# Patient Record
Sex: Male | Born: 2000 | Race: Black or African American | Hispanic: No | Marital: Single | Smoking: Never smoker
Health system: Southern US, Community
[De-identification: ages and names within clinical notes are randomized; demographics above are authoritative.]

## PROBLEM LIST (undated history)

## (undated) DIAGNOSIS — M25561 Pain in right knee: Secondary | ICD-10-CM

## (undated) DIAGNOSIS — M25562 Pain in left knee: Secondary | ICD-10-CM

## (undated) HISTORY — PX: OTHER SURGICAL HISTORY: SHX169

## (undated) HISTORY — DX: Pain in right knee: M25.561

## (undated) HISTORY — DX: Pain in left knee: M25.562

---
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
## 2022-06-12 ENCOUNTER — Encounter (HOSPITAL_BASED_OUTPATIENT_CLINIC_OR_DEPARTMENT_OTHER): Payer: Self-pay | Admitting: Emergency Medicine

## 2022-06-12 ENCOUNTER — Emergency Department (HOSPITAL_BASED_OUTPATIENT_CLINIC_OR_DEPARTMENT_OTHER): Payer: 59

## 2022-06-12 ENCOUNTER — Emergency Department (HOSPITAL_BASED_OUTPATIENT_CLINIC_OR_DEPARTMENT_OTHER)
Admission: EM | Admit: 2022-06-12 | Discharge: 2022-06-12 | Disposition: A | Discharge status: Home / Self Care | Payer: 59 | Attending: Emergency Medicine | Admitting: Emergency Medicine

## 2022-06-12 ENCOUNTER — Other Ambulatory Visit: Payer: Self-pay

## 2022-06-12 DIAGNOSIS — S91331A Puncture wound without foreign body, right foot, initial encounter: Secondary | ICD-10-CM | POA: Diagnosis not present

## 2022-06-12 DIAGNOSIS — X58XXXA Exposure to other specified factors, initial encounter: Secondary | ICD-10-CM | POA: Diagnosis not present

## 2022-06-12 DIAGNOSIS — S99921A Unspecified injury of right foot, initial encounter: Secondary | ICD-10-CM | POA: Diagnosis present

## 2022-06-12 MED ORDER — CIPROFLOXACIN HCL 500 MG PO TABS
500.0000 mg | ORAL_TABLET | Freq: Once | ORAL | Status: AC
  Administered 2022-06-12: 500 mg via ORAL
  Filled 2022-06-12: qty 1

## 2022-06-12 MED ORDER — CIPROFLOXACIN HCL 500 MG PO TABS: 500.0000 mg | ORAL_TABLET | Freq: Two times a day (BID) | ORAL | 0 refills | Status: AC

## 2022-06-12 NOTE — ED Provider Notes (Signed)
[facility] EMERGENCY DEPARTMENT Provider Note   CSN: [id number] Arrival date & time: 06/12/22  1848     History  Chief Complaint  Patient presents with   Foot Pain    Brian Sutton is a 21 y.o. male.  Brian Sutton is a 21 y.o. male who is otherwise healthy, presents to the ED for evaluation of wound to the bottom of his right foot.  [place].  He removed the splinter.  [place].  He reports since then he has had a little bit of redness and swelling.  He reports his sister squeezed a tiny amount of pus from the puncture wound but he is continued to have pain that is worse with weightbearing.  No fevers or chills.  Reports a small area of swelling.  Tetanus up-to-date.   The history is provided by the patient.  Foot Pain       Home Medications Prior to Admission medications   Medication Sig Start Date End Date Taking? Authorizing Provider  ciprofloxacin (CIPRO) 500 MG tablet Take 1 tablet (500 mg total) by mouth 2 (two) times daily. One po bid x 7 days 06/12/22  Yes Dartha Lodge, PA-C  acetaminophen (TYLENOL) 325 MG tablet Take 2 tablets (650 mg total) by mouth every 6 (six) hours as needed for headache. 10/08/14   Fayrene Helper, PA-C  naproxen sodium (ANAPROX) 220 MG tablet Take 220 mg by mouth as needed.     [provider]      Allergies    Patient has no known allergies.    Review of Systems   Review of Systems  Constitutional:  Negative for chills and fever.  Skin:  Positive for wound.    Physical Exam Updated Vital Signs BP (!) 147/90 (BP Location: Right Arm)   Pulse 90   Temp 98 F (36.7 C) (Oral)   Resp 18   Ht 5\' 6"  (1.676 m)   Wt 60.3 kg   SpO2 93%   BMI 21.46 kg/m  Physical Exam Vitals and nursing note reviewed.  Constitutional:      General: He is not in acute distress.    Appearance:  Normal appearance. He is well-developed. He is not ill-appearing or diaphoretic.  HENT:     Head: Normocephalic and atraumatic.  Eyes:     General:        Right eye: No discharge.        Left eye: No discharge.  Pulmonary:     Effort: Pulmonary effort is normal. No respiratory distress.  Musculoskeletal:     Comments: There is a very small puncture wound to the bottom of the right foot at the base of the big toe with a little bit of erythema, no fluctuance or expressible drainage.  No palpable foreign body.  No lymphangitic streaking or significant soft tissue swelling.  Distal pulses 2+ and normal cap refill.  Skin:    General: Skin is warm and dry.  Neurological:     Mental Status: He is alert and oriented to person, place, and time.     Coordination: Coordination normal.  Psychiatric:        Mood and Affect: Mood normal.        Behavior: Behavior normal.     ED Results / Procedures / Treatments  Labs (all labs ordered are listed, but only abnormal results are displayed) Labs Reviewed - No data to display  EKG None  Radiology DG Foot Complete Right  Result Date: 06/12/2022 CLINICAL DATA:  Swelling and pain in the foot after removing a splinter EXAM: RIGHT FOOT COMPLETE - 3+ VIEW COMPARISON:  None Available. FINDINGS: There is no evidence of fracture or dislocation. There is no evidence of arthropathy or other focal bone abnormality. Soft tissues are unremarkable. No radiopaque foreign body. A 2 IMPRESSION: Negative. Electronically Signed   By: Emmaline Kluver M.D.   On: 06/12/2022 19:18    Procedures Procedures    Medications Ordered in ED Medications  ciprofloxacin (CIPRO) tablet 500 mg (has no administration in time range)    ED Course/ Medical Decision Making/ A&P                           Medical Decision Making Amount and/or Complexity of Data Reviewed Radiology: ordered.  Risk Prescription drug management.   21 year old male presents with wound to the  bottom of the right foot where he [place].  Reports some worsening pain and swelling and a little bit of redness.  No expressible drainage or fluctuance on exam and no palpable foreign body.  X-ray reviewed and interpreted by myself with no radiopaque foreign body, bony abnormality or significant soft tissue abnormality.  Do not feel there is any drainable abscess on exam, but will treat with Cipro for puncture wound to the plantar surface of the foot.  Discussed return precautions and outpatient follow-up with patient.  Discharged home in good condition.        Final Clinical Impression(s) / ED Diagnoses Final diagnoses:  Puncture wound of plantar aspect of right foot, initial encounter    Rx / DC Orders ED Discharge Orders          Ordered    ciprofloxacin (CIPRO) 500 MG tablet  2 times daily        06/12/22 2121              Dartha Lodge, PA-C 06/22/22 0356    Charlynne Pander, MD 06/24/22 1600

## 2022-06-20 ENCOUNTER — Other Ambulatory Visit: Payer: Self-pay

## 2022-06-20 ENCOUNTER — Encounter (HOSPITAL_BASED_OUTPATIENT_CLINIC_OR_DEPARTMENT_OTHER): Payer: Self-pay

## 2022-06-20 ENCOUNTER — Emergency Department (HOSPITAL_BASED_OUTPATIENT_CLINIC_OR_DEPARTMENT_OTHER)
Admission: EM | Admit: 2022-06-20 | Discharge: 2022-06-20 | Disposition: A | Discharge status: Home / Self Care | Payer: 59 | Attending: Emergency Medicine | Admitting: Emergency Medicine

## 2022-06-20 DIAGNOSIS — Z4889 Encounter for other specified surgical aftercare: Secondary | ICD-10-CM | POA: Diagnosis present

## 2022-06-20 DIAGNOSIS — Z23 Encounter for immunization: Secondary | ICD-10-CM | POA: Insufficient documentation

## 2022-06-20 DIAGNOSIS — M79671 Pain in right foot: Secondary | ICD-10-CM | POA: Diagnosis not present

## 2022-06-20 MED ORDER — TETANUS-DIPHTH-ACELL PERTUSSIS 5-2.5-18.5 LF-MCG/0.5 IM SUSY
0.5000 mL | PREFILLED_SYRINGE | Freq: Once | INTRAMUSCULAR | Status: AC
  Administered 2022-06-20: 0.5 mL via INTRAMUSCULAR
  Filled 2022-06-20: qty 0.5

## 2022-06-20 MED ORDER — CEPHALEXIN 500 MG PO CAPS: 500.0000 mg | ORAL_CAPSULE | Freq: Four times a day (QID) | ORAL | 0 refills | Status: AC

## 2022-06-20 NOTE — ED Triage Notes (Signed)
Patient was seen 6/26 with complaints of splinter from the bottom of his right foot - Patient is here today due to increased pain.

## 2022-06-20 NOTE — Discharge Instructions (Signed)
Tetanus was updated today, this will be good for 10 years.  He can take Tylenol Motrin for pain.  Take Keflex 4 times daily for the next 5 days for infectious symptoms.  Continue doing warm compresses.  Return to the ED for fevers, chills, spreading redness around the foot.

## 2022-06-20 NOTE — ED Provider Notes (Signed)
  [facility] EMERGENCY DEPARTMENT Provider Note   CSN: [id number] Arrival date & time: 06/20/22  1612     History  Chief Complaint  Patient presents with   Foot Pain    Brian Sutton is a 21 y.o. male.   Foot Pain    Patient presents due to wound recheck.  Patient had a splinter on his foot, was seen 06/12/2022 and given antibiotics.  Has been taking the ciprofloxacin, finished dose yesterday.  Denies any fevers, redness, warmth.  He is having pain and states there has been some drainage from the site.  Denies any paresthesias, no history of diabetes.  Home Medications Prior to Admission medications   Medication Sig Start Date End Date Taking? Authorizing Provider  acetaminophen (TYLENOL) 325 MG tablet Take 2 tablets (650 mg total) by mouth every 6 (six) hours as needed for headache. 10/08/14   Fayrene Helper, PA-C  ciprofloxacin (CIPRO) 500 MG tablet Take 1 tablet (500 mg total) by mouth 2 (two) times daily. One po bid x 7 days 06/12/22   Dartha Lodge, PA-C  naproxen sodium (ANAPROX) 220 MG tablet Take 220 mg by mouth as needed.     [provider]      Allergies    Patient has no known allergies.    Review of Systems   Review of Systems  Physical Exam Updated Vital Signs BP (!) 145/66 (BP Location: Left Arm)   Pulse 75   Temp 97.9 F (36.6 C) (Oral)   Resp 18   Ht 5\' 6"  (1.676 m)   Wt 60.3 kg   SpO2 97%   BMI 21.46 kg/m  Physical Exam   ED Results / Procedures / Treatments   Labs (all labs ordered are listed, but only abnormal results are displayed) Labs Reviewed - No data to display  EKG None  Radiology No results found.  Procedures Procedures    Medications Ordered in ED Medications  Tdap (BOOSTRIX) injection 0.5 mL (has no administration in time range)    ED Course/ Medical Decision Making/ A&P                           Medical Decision Making  Patient is neurovascular intact with brisk cap refill and DP PT 2+.  Tolerates  ROM, some tenderness with palpation to the ventral side of his right foot.  Good cap refill, there is no surrounding erythema or warmth I do not think this is cellulitis.  No drainage appreciated on my exam, wound appears to be well-healing..  We will switch to different antibiotic  Reviewed x-rays from 06/12/2022, unremarkable for any retained foreign body or acute process.  Patient is unsure when his last tetanus shot was, I cannot see any documentation of it being administered in his last visit.  We will administer tetanus today.  Encouraged him to continue doing warm compresses, will also give additional week of antibiotic for more coverage.  Patient structured to return for fever, worsening redness, new symptoms.  Otherwise discharged in stable condition.        Final Clinical Impression(s) / ED Diagnoses Final diagnoses:  None    Rx / DC Orders ED Discharge Orders     None         06/14/2022, Theron Arista 06/20/22 1631    08/21/22, MD 06/20/22 2112
